# Patient Record
Sex: Female | Born: 1955 | Race: Black or African American | Hispanic: No | Smoking: Never smoker
Health system: Southern US, Community
[De-identification: ages and names within clinical notes are randomized; demographics above are authoritative.]

## PROBLEM LIST (undated history)

## (undated) DIAGNOSIS — I10 Essential (primary) hypertension: Secondary | ICD-10-CM

## (undated) DIAGNOSIS — C801 Malignant (primary) neoplasm, unspecified: Secondary | ICD-10-CM

---

## 1998-05-03 ENCOUNTER — Emergency Department (HOSPITAL_COMMUNITY): Admission: EM | Admit: 1998-05-03 | Discharge: 1998-05-03 | Payer: Self-pay | Admitting: Emergency Medicine

## 1998-06-11 ENCOUNTER — Emergency Department (HOSPITAL_COMMUNITY): Admission: EM | Admit: 1998-06-11 | Discharge: 1998-06-11 | Payer: Self-pay | Admitting: Emergency Medicine

## 1998-06-11 ENCOUNTER — Encounter: Payer: Self-pay | Admitting: Emergency Medicine

## 1999-02-07 ENCOUNTER — Emergency Department (HOSPITAL_COMMUNITY): Admission: EM | Admit: 1999-02-07 | Discharge: 1999-02-07 | Payer: Self-pay | Admitting: Emergency Medicine

## 1999-08-25 ENCOUNTER — Emergency Department (HOSPITAL_COMMUNITY): Admission: EM | Admit: 1999-08-25 | Discharge: 1999-08-25 | Payer: Self-pay | Admitting: Emergency Medicine

## 2002-06-25 ENCOUNTER — Encounter: Payer: Self-pay | Admitting: Emergency Medicine

## 2002-06-25 ENCOUNTER — Emergency Department (HOSPITAL_COMMUNITY): Admission: EM | Admit: 2002-06-25 | Discharge: 2002-06-26 | Payer: Self-pay

## 2004-05-31 ENCOUNTER — Emergency Department (HOSPITAL_COMMUNITY): Admission: EM | Admit: 2004-05-31 | Discharge: 2004-05-31 | Payer: Self-pay | Admitting: Family Medicine

## 2005-11-13 ENCOUNTER — Ambulatory Visit: Payer: Self-pay | Admitting: Nurse Practitioner

## 2005-11-13 ENCOUNTER — Ambulatory Visit: Payer: Self-pay | Admitting: *Deleted

## 2005-11-20 ENCOUNTER — Ambulatory Visit (HOSPITAL_COMMUNITY): Admission: RE | Admit: 2005-11-20 | Discharge: 2005-11-20 | Payer: Self-pay | Admitting: Family Medicine

## 2005-11-27 ENCOUNTER — Ambulatory Visit: Payer: Self-pay | Admitting: Nurse Practitioner

## 2005-12-11 ENCOUNTER — Ambulatory Visit: Payer: Self-pay | Admitting: Nurse Practitioner

## 2006-01-08 ENCOUNTER — Ambulatory Visit: Payer: Self-pay | Admitting: Nurse Practitioner

## 2006-01-26 ENCOUNTER — Ambulatory Visit: Payer: Self-pay | Admitting: Nurse Practitioner

## 2006-04-20 ENCOUNTER — Ambulatory Visit: Payer: Self-pay | Admitting: Nurse Practitioner

## 2006-06-21 ENCOUNTER — Ambulatory Visit: Payer: Self-pay | Admitting: Nurse Practitioner

## 2007-01-19 ENCOUNTER — Ambulatory Visit: Payer: Self-pay | Admitting: Internal Medicine

## 2007-04-13 ENCOUNTER — Encounter (INDEPENDENT_AMBULATORY_CARE_PROVIDER_SITE_OTHER): Payer: Self-pay | Admitting: *Deleted

## 2007-04-20 ENCOUNTER — Ambulatory Visit: Payer: Self-pay | Admitting: Family Medicine

## 2007-04-20 ENCOUNTER — Encounter (INDEPENDENT_AMBULATORY_CARE_PROVIDER_SITE_OTHER): Payer: Self-pay | Admitting: Nurse Practitioner

## 2007-04-20 LAB — CONVERTED CEMR LAB
ALT: 9 units/L (ref 0–35)
AST: 12 units/L (ref 0–37)
Albumin: 4.5 g/dL (ref 3.5–5.2)
Alkaline Phosphatase: 65 units/L (ref 39–117)
BUN: 14 mg/dL (ref 6–23)
Basophils Absolute: 0 10*3/uL (ref 0.0–0.1)
Basophils Relative: 0 % (ref 0–1)
CO2: 26 meq/L (ref 19–32)
Calcium: 9.3 mg/dL (ref 8.4–10.5)
Chloride: 98 meq/L (ref 96–112)
Creatinine, Ser: 1.2 mg/dL (ref 0.40–1.20)
Eosinophils Absolute: 0.2 10*3/uL (ref 0.0–0.7)
Eosinophils Relative: 2 % (ref 0–5)
Glucose, Bld: 74 mg/dL (ref 70–99)
HCT: 39 % (ref 36.0–46.0)
Hemoglobin: 13.4 g/dL (ref 12.0–15.0)
Lymphocytes Relative: 33 % (ref 12–46)
Lymphs Abs: 2.6 10*3/uL (ref 0.7–3.3)
MCHC: 34.4 g/dL (ref 30.0–36.0)
MCV: 83.5 fL (ref 78.0–100.0)
Monocytes Absolute: 0.4 10*3/uL (ref 0.2–0.7)
Monocytes Relative: 5 % (ref 3–11)
Neutro Abs: 4.8 10*3/uL (ref 1.7–7.7)
Neutrophils Relative %: 60 % (ref 43–77)
Platelets: 182 10*3/uL (ref 150–400)
Potassium: 3.3 meq/L — ABNORMAL LOW (ref 3.5–5.3)
RBC: 4.67 M/uL (ref 3.87–5.11)
RDW: 14.7 % — ABNORMAL HIGH (ref 11.5–14.0)
Sodium: 137 meq/L (ref 135–145)
TSH: 1.769 microintl units/mL (ref 0.350–5.50)
Total Bilirubin: 0.4 mg/dL (ref 0.3–1.2)
Total Protein: 8.2 g/dL (ref 6.0–8.3)
WBC: 8 10*3/uL (ref 4.0–10.5)

## 2007-04-26 ENCOUNTER — Ambulatory Visit (HOSPITAL_COMMUNITY): Admission: RE | Admit: 2007-04-26 | Discharge: 2007-04-26 | Payer: Self-pay | Admitting: Nurse Practitioner

## 2007-05-04 ENCOUNTER — Encounter (INDEPENDENT_AMBULATORY_CARE_PROVIDER_SITE_OTHER): Payer: Self-pay | Admitting: Nurse Practitioner

## 2007-05-04 ENCOUNTER — Ambulatory Visit: Payer: Self-pay | Admitting: Family Medicine

## 2007-05-04 LAB — CONVERTED CEMR LAB
Cholesterol: 188 mg/dL (ref 0–200)
HDL: 68 mg/dL (ref >39)
LDL Cholesterol: 109 mg/dL — ABNORMAL HIGH (ref 0–99)
Total CHOL/HDL Ratio: 2.8
Triglycerides: 56 mg/dL (ref <150)
VLDL: 11 mg/dL (ref 0–40)

## 2015-03-05 ENCOUNTER — Encounter (HOSPITAL_COMMUNITY): Payer: Self-pay | Admitting: Emergency Medicine

## 2015-03-05 ENCOUNTER — Emergency Department (HOSPITAL_COMMUNITY)
Admission: EM | Admit: 2015-03-05 | Discharge: 2015-03-05 | Disposition: A | Discharge status: Home / Self Care | Payer: Non-veteran care | Attending: Emergency Medicine | Admitting: Emergency Medicine

## 2015-03-05 ENCOUNTER — Emergency Department (HOSPITAL_COMMUNITY): Payer: Non-veteran care

## 2015-03-05 DIAGNOSIS — M79641 Pain in right hand: Secondary | ICD-10-CM | POA: Diagnosis not present

## 2015-03-05 DIAGNOSIS — I10 Essential (primary) hypertension: Secondary | ICD-10-CM | POA: Diagnosis not present

## 2015-03-05 DIAGNOSIS — R2232 Localized swelling, mass and lump, left upper limb: Secondary | ICD-10-CM | POA: Insufficient documentation

## 2015-03-05 HISTORY — DX: Essential (primary) hypertension: I10

## 2015-03-05 MED ORDER — NAPROXEN 500 MG PO TABS
500.0000 mg | ORAL_TABLET | Freq: Two times a day (BID) | ORAL | Status: DC
Start: 2015-03-05 — End: 2020-08-30

## 2015-03-05 NOTE — ED Notes (Signed)
Pt c/o left wrist/hand pain. 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 Pt supporting left wrist with right hand. No deformity.

## 2015-03-05 NOTE — Discharge Instructions (Signed)
Take the prescribed medication as directed. °Follow-up with your primary care physician. °Return to the ED for new or worsening symptoms. ° °

## 2015-03-05 NOTE — ED Provider Notes (Signed)
CSN: [id number]     Arrival date & time 03/05/15  1356 History   First MD Initiated Contact with Patient 03/05/15 1410     Chief Complaint  Patient presents with  . Hand Pain     (Consider location/radiation/quality/duration/timing/severity/associated sxs/prior Treatment) The history is provided by the patient and medical records.    59 year old female with history of hypertension, here with left hand pain. Patient states she was at [employer] training 4 days ago and thinks she hit her left hand on a piece of metal. She states initially pain was mild and she finished her day as normal. She states since this time she has had worsening pain of her left dorsal hand and some mild swelling. She denies any fever or chills. No numbness or weakness of left hand. Patient is right-hand dominant.  Patient has tried applying ice prior to arrival without relief.  VSS.  Past Medical History  Diagnosis Date  . Hypertension    Past Surgical History  Procedure Laterality Date  . Cesarean section     No family history on file. History  Substance Use Topics  . Smoking status: Never Smoker   . Smokeless tobacco: Not on file  . Alcohol Use: No   OB History    No data available     Review of Systems  Musculoskeletal: Positive for arthralgias.  All other systems reviewed and are negative.     Allergies  Review of patient's allergies indicates no known allergies.  Home Medications   Prior to Admission medications   Not on File   BP 151/82 mmHg  Pulse 98  Temp(Src) 98.5 F (36.9 C) (Oral)  Resp 16  SpO2 99%   Physical Exam  Constitutional: She is oriented to person, place, and time. She appears well-developed and well-nourished. No distress.  HENT:  Head: Normocephalic and atraumatic.  Mouth/Throat: Oropharynx is clear and moist.  Eyes: Conjunctivae and EOM are normal. Pupils are equal, round, and reactive to light.  Neck: Normal range of motion. Neck supple.  Cardiovascular: Normal  rate, regular rhythm and normal heart sounds.   Pulmonary/Chest: Effort normal and breath sounds normal. No respiratory distress. She has no wheezes.  Musculoskeletal: She exhibits no edema.       Left hand: She exhibits decreased range of motion, tenderness, bony tenderness and swelling.       Hands: Left hand with tenderness and mild swelling noted along dorsal aspect, worse along second and third metacarpals; no acute deformity noted, moving all fingers appropriately; hand is NVI  Neurological: She is alert and oriented to person, place, and time.  Skin: Skin is warm and dry. She is not diaphoretic.  Psychiatric: She has a normal mood and affect.  Nursing note and vitals reviewed.   ED Course  ORTHOPEDIC INJURY TREATMENT Date/Time: 03/05/2015 3:54 PM Performed by: Larene Pickett Authorized by: Larene Pickett Consent: Verbal consent obtained. Risks and benefits: risks, benefits and alternatives were discussed Consent given by: patient Patient understanding: patient states understanding of the procedure being performed Required items: required blood products, implants, devices, and special equipment available Patient identity confirmed: verbally with patient Time out: Immediately prior to procedure a "time out" was called to verify the correct patient, procedure, equipment, support staff and site/side marked as required. Injury location: hand Location details: left hand Injury type: soft tissue Pre-procedure neurovascular assessment: neurovascularly intact Local anesthesia used: no Patient sedated: no Immobilization: cast Supplies used: elastic bandage Post-procedure neurovascular assessment: post-procedure neurovascularly intact Patient  tolerance: Patient tolerated the procedure well with no immediate complications   (including critical care time) Labs Review Labs Reviewed - No data to display  Imaging Review Dg Hand Complete Left  03/05/2015   CLINICAL DATA:  Hand pain and  swelling. Injury 3 days ago. History of third PIP dislocation 10 years ago.  EXAM: LEFT HAND - COMPLETE 3+ VIEW  COMPARISON:  None.  FINDINGS: Moderate degenerative change base of thumb. Deformity of the third PIP joint with degenerative change. This appears chronic.  Negative for acute fracture.  IMPRESSION: Negative for acute fracture.   Electronically Signed   By: Franchot Gallo M.D.   On: 03/05/2015 15:26     EKG Interpretation None      MDM   Final diagnoses:  Hand pain, right   58 year old female here with left hand pain. She states she hit it on a piece of metal 4 days ago. She has mild swelling and tenderness to dorsum of left hand along second and third metacarpals. She has no acute bony deformity. Her hand is neurovascularly intact. X-ray was obtained which is negative for acute findings. Ace wrap was applied for comfort. Patient we discharged home with supportive care. She was encouraged to follow with her PCP.  Discussed plan with patient, he/she acknowledged understanding and agreed with plan of care.  Return precautions given for new or worsening symptoms.  Larene Pickett, PA-C 03/05/15 1558  Blanchie Dessert, MD 03/11/15 2115

## 2015-04-13 ENCOUNTER — Emergency Department (HOSPITAL_COMMUNITY)
Admission: EM | Admit: 2015-04-13 | Discharge: 2015-04-13 | Disposition: A | Discharge status: Home / Self Care | Payer: Non-veteran care | Attending: Emergency Medicine | Admitting: Emergency Medicine

## 2015-04-13 ENCOUNTER — Emergency Department (HOSPITAL_COMMUNITY): Payer: Non-veteran care

## 2015-04-13 ENCOUNTER — Encounter (HOSPITAL_COMMUNITY): Payer: Self-pay | Admitting: *Deleted

## 2015-04-13 DIAGNOSIS — N938 Other specified abnormal uterine and vaginal bleeding: Secondary | ICD-10-CM | POA: Insufficient documentation

## 2015-04-13 DIAGNOSIS — R61 Generalized hyperhidrosis: Secondary | ICD-10-CM | POA: Insufficient documentation

## 2015-04-13 DIAGNOSIS — I10 Essential (primary) hypertension: Secondary | ICD-10-CM | POA: Insufficient documentation

## 2015-04-13 DIAGNOSIS — N939 Abnormal uterine and vaginal bleeding, unspecified: Secondary | ICD-10-CM

## 2015-04-13 DIAGNOSIS — R42 Dizziness and giddiness: Secondary | ICD-10-CM | POA: Insufficient documentation

## 2015-04-13 DIAGNOSIS — R531 Weakness: Secondary | ICD-10-CM | POA: Insufficient documentation

## 2015-04-13 DIAGNOSIS — Z791 Long term (current) use of non-steroidal anti-inflammatories (NSAID): Secondary | ICD-10-CM | POA: Insufficient documentation

## 2015-04-13 DIAGNOSIS — R109 Unspecified abdominal pain: Secondary | ICD-10-CM | POA: Insufficient documentation

## 2015-04-13 LAB — CBC WITH DIFFERENTIAL/PLATELET
Basophils Absolute: 0 K/uL (ref 0.0–0.1)
Basophils Relative: 0 %
Eosinophils Absolute: 0.2 K/uL (ref 0.0–0.7)
Eosinophils Relative: 2 %
HCT: 32.4 % — ABNORMAL LOW (ref 36.0–46.0)
Hemoglobin: 11.4 g/dL — ABNORMAL LOW (ref 12.0–15.0)
Lymphocytes Relative: 23 %
Lymphs Abs: 1.7 K/uL (ref 0.7–4.0)
MCH: 29.1 pg (ref 26.0–34.0)
MCHC: 35.2 g/dL (ref 30.0–36.0)
MCV: 82.7 fL (ref 78.0–100.0)
Monocytes Absolute: 0.4 K/uL (ref 0.1–1.0)
Monocytes Relative: 6 %
Neutro Abs: 5.2 K/uL (ref 1.7–7.7)
Neutrophils Relative %: 69 %
Platelets: 204 K/uL (ref 150–400)
RBC: 3.92 MIL/uL (ref 3.87–5.11)
RDW: 14.6 % (ref 11.5–15.5)
WBC: 7.5 K/uL (ref 4.0–10.5)

## 2015-04-13 LAB — COMPREHENSIVE METABOLIC PANEL WITH GFR
ALT: 15 U/L (ref 14–54)
AST: 18 U/L (ref 15–41)
Albumin: 3.3 g/dL — ABNORMAL LOW (ref 3.5–5.0)
Alkaline Phosphatase: 51 U/L (ref 38–126)
Anion gap: 8 (ref 5–15)
BUN: 8 mg/dL (ref 6–20)
CO2: 26 mmol/L (ref 22–32)
Calcium: 8.6 mg/dL — ABNORMAL LOW (ref 8.9–10.3)
Chloride: 103 mmol/L (ref 101–111)
Creatinine, Ser: 1.22 mg/dL — ABNORMAL HIGH (ref 0.44–1.00)
GFR calc Af Amer: 55 mL/min — ABNORMAL LOW
GFR calc non Af Amer: 47 mL/min — ABNORMAL LOW
Glucose, Bld: 161 mg/dL — ABNORMAL HIGH (ref 65–99)
Potassium: 3 mmol/L — ABNORMAL LOW (ref 3.5–5.1)
Sodium: 137 mmol/L (ref 135–145)
Total Bilirubin: 0.5 mg/dL (ref 0.3–1.2)
Total Protein: 6.7 g/dL (ref 6.5–8.1)

## 2015-04-13 MED ORDER — MEDROXYPROGESTERONE ACETATE 10 MG PO TABS: 10.0000 mg | ORAL_TABLET | Freq: Every day | ORAL | Status: AC

## 2015-04-13 NOTE — Discharge Instructions (Signed)
Provera as prescribed.  Follow-up with your GYN doctor in the upcoming week, and return to the ER if symptoms significantly worsen or change.   Abnormal Uterine Bleeding Abnormal uterine bleeding can affect women at various stages in life, including teenagers, women in their reproductive years, pregnant women, and women who have reached menopause. Several kinds of uterine bleeding are considered abnormal, including:  Bleeding or spotting between periods.   Bleeding after sexual intercourse.   Bleeding that is heavier or more than normal.   Periods that last longer than usual.  Bleeding after menopause.  Many cases of abnormal uterine bleeding are minor and simple to treat, while others are more serious. Any type of abnormal bleeding should be evaluated by your health care provider. Treatment will depend on the cause of the bleeding. HOME CARE INSTRUCTIONS Monitor your condition for any changes. The following actions may help to alleviate any discomfort you are experiencing:  Avoid the use of tampons and douches as directed by your health care provider.  Change your pads frequently. You should get regular pelvic exams and Pap tests. Keep all follow-up appointments for diagnostic tests as directed by your health care provider.  SEEK MEDICAL CARE IF:   Your bleeding lasts more than 1 week.   You feel dizzy at times.  SEEK IMMEDIATE MEDICAL CARE IF:   You pass out.   You are changing pads every 15 to 30 minutes.   You have abdominal pain.  You have a fever.   You become sweaty or weak.   You are passing large blood clots from the vagina.   You start to feel nauseous and vomit. MAKE SURE YOU:   Understand these instructions.  Will watch your condition.  Will get help right away if you are not doing well or get worse. Document Released: 07/13/2005 Document Revised: 07/18/2013 Document Reviewed: 02/09/2013 [facility] Patient Information 2015 [place], [place].  This information is not intended to replace advice given to you by your health care provider. Make sure you discuss any questions you have with your health care provider.

## 2015-04-13 NOTE — ED Notes (Signed)
Heavy vaginal bleeding since this am she has seen her doctor and is having a workup for the same sept 30.. lmp  Last month.  She saw her doctor 2 weeks ago

## 2015-04-13 NOTE — ED Provider Notes (Signed)
CSN: [id number]   Arrival date & time 04/13/15 0012  History  This chart was scribed for Veryl Speak, MD by Altamease Oiler, ED Scribe. This patient was seen in room A04C/A04C and the patient's care was started at 12:41 AM.  Chief Complaint  Patient presents with  . Vaginal Bleeding    HPI The history is provided by the patient. No language interpreter was used.   Elizabeth Ali is a 59 y.o. female who presents to the Emergency Department complaining of heavy vaginal bleeding with onset this morning. The bleeding was heavy earlier in the day, slowed down at work, and increased again 1 hour PTA. She has been passing clots. The blood is bright red and the clots are dark red. Associated symptoms include dizziness, generalized weakness, sweating, and abdominal cramping. Pt denies history of hysterectomy or oophorectomy.  She saw her doctor for vaginal spotting 2 weeks ago and is scheduled for GYN f/u on 04/26/15.  Past Medical History  Diagnosis Date  . Hypertension     Past Surgical History  Procedure Laterality Date  . Cesarean section      No family history on file.  Social History  Substance Use Topics  . Smoking status: Never Smoker   . Smokeless tobacco: None  . Alcohol Use: No     Review of Systems 10 Systems reviewed and all are negative for acute change except as noted in the HPI. Home Medications   Prior to Admission medications   Medication Sig Start Date End Date Taking? Authorizing Provider  naproxen (NAPROSYN) 500 MG tablet Take 1 tablet (500 mg total) by mouth 2 (two) times daily with a meal. 03/05/15   Larene Pickett, PA-C    Allergies  Review of patient's allergies indicates no known allergies.  Triage Vitals: BP 100/66 mmHg  Pulse 87  Temp(Src) 97.6 F (36.4 C) (Oral)  Resp 16  SpO2 96%  Physical Exam  Constitutional: She is oriented to person, place, and time. She appears well-developed and well-nourished. No distress.  HENT:  Head: Normocephalic and  atraumatic.  Eyes: EOM are normal.  Neck: Normal range of motion.  Cardiovascular: Normal rate, regular rhythm and normal heart sounds.   Pulmonary/Chest: Effort normal and breath sounds normal.  Abdominal: Soft. She exhibits no distension and no mass. There is no tenderness. There is no rebound and no guarding.  Musculoskeletal: Normal range of motion.  Neurological: She is alert and oriented to person, place, and time.  Skin: Skin is warm and dry.  Psychiatric: She has a normal mood and affect. Judgment normal.  Nursing note and vitals reviewed.   ED Course  Procedures   DIAGNOSTIC STUDIES: Oxygen Saturation is 96% on RA, normal by my interpretation.    COORDINATION OF CARE: 12:45 AM Discussed treatment plan which includes lab work, pelvic US, and pelvic exam with pt at bedside and pt agreed to plan.  I, Veryl Speak, MD, personally reviewed and evaluated these images and lab results as part of my medical decision-making.   Labs Reviewed  CBC WITH DIFFERENTIAL/PLATELET - Abnormal; Notable for the following:    Hemoglobin 11.4 (*)    HCT 32.4 (*)    All other components within normal limits  COMPREHENSIVE METABOLIC PANEL - Abnormal; Notable for the following:    Potassium 3.0 (*)    Glucose, Bld 161 (*)    Creatinine, Ser 1.22 (*)    Calcium 8.6 (*)    Albumin 3.3 (*)    GFR calc non  Af Amer 47 (*)    GFR calc Af Amer 55 (*)    All other components within normal limits    Imaging Review US Transvaginal Non-ob  04/13/2015   CLINICAL DATA:  Heavy vaginal bleeding beginning this morning at 7 a.m. last menstrual period March 16, 2015. G3 P3.  EXAM: TRANSABDOMINAL AND TRANSVAGINAL ULTRASOUND OF PELVIS  TECHNIQUE: Both transabdominal and transvaginal ultrasound examinations of the pelvis were performed. Transabdominal technique was performed for global imaging of the pelvis including uterus, ovaries, adnexal regions, and pelvic cul-de-sac. It was necessary to proceed with  endovaginal exam following the transabdominal exam to visualize the endometrium.  COMPARISON:  None  FINDINGS: Technically limited evaluation, endometrium not well sonographically characterized.  Uterus  Measurements: 11.1 x 6.5 x 6.2 cm. No fibroids or other mass visualized. Small anechoic nabothian cyst at the level of the cervix.  Endometrium  Thickness: Approximately 6 mm. No focal abnormality visualized though limited assessment.  Right ovary  Not sonographically identified.  Left ovary  Not sonographically identified.  Other findings  No free fluid.  IMPRESSION: Ovaries not sonographically identified, endometrium not well sonographically characterized.  No acute pelvic process.   Electronically Signed   By: Elon Alas M.D.   On: 04/13/2015 02:59   US Pelvis Complete  04/13/2015   CLINICAL DATA:  Heavy vaginal bleeding beginning this morning at 7 a.m. last menstrual period March 16, 2015. G3 P3.  EXAM: TRANSABDOMINAL AND TRANSVAGINAL ULTRASOUND OF PELVIS  TECHNIQUE: Both transabdominal and transvaginal ultrasound examinations of the pelvis were performed. Transabdominal technique was performed for global imaging of the pelvis including uterus, ovaries, adnexal regions, and pelvic cul-de-sac. It was necessary to proceed with endovaginal exam following the transabdominal exam to visualize the endometrium.  COMPARISON:  None  FINDINGS: Technically limited evaluation, endometrium not well sonographically characterized.  Uterus  Measurements: 11.1 x 6.5 x 6.2 cm. No fibroids or other mass visualized. Small anechoic nabothian cyst at the level of the cervix.  Endometrium  Thickness: Approximately 6 mm. No focal abnormality visualized though limited assessment.  Right ovary  Not sonographically identified.  Left ovary  Not sonographically identified.  Other findings  No free fluid.  IMPRESSION: Ovaries not sonographically identified, endometrium not well sonographically characterized.  No acute pelvic  process.   Electronically Signed   By: Elon Alas M.D.   On: 04/13/2015 02:59      MDM   Final diagnoses:  Vaginal bleeding     Hemoglobin is stable, vitals are stable, and ultrasound reveals no significant uterine abnormality. She will be started on Provera and advised to follow-up with her gynecologist. She understands to return if symptoms significantly worsen or change.   I personally performed the services described in this documentation, which was scribed in my presence. The recorded information has been reviewed and is accurate.     Veryl Speak, MD 04/13/15 [id number]

## 2020-03-19 HISTORY — PX: BREAST LUMPECTOMY: SHX2

## 2020-07-31 ENCOUNTER — Ambulatory Visit
Admission: RE | Admit: 2020-07-31 | Discharge: 2020-07-31 | Disposition: A | Discharge status: Home / Self Care | Payer: Self-pay | Source: Ambulatory Visit | Attending: Radiation Oncology | Admitting: Radiation Oncology

## 2020-07-31 ENCOUNTER — Other Ambulatory Visit: Payer: Self-pay | Admitting: Radiation Oncology

## 2020-07-31 ENCOUNTER — Inpatient Hospital Stay
Admission: RE | Admit: 2020-07-31 | Discharge: 2020-07-31 | Disposition: A | Discharge status: Home / Self Care | Payer: Self-pay | Source: Ambulatory Visit | Attending: Radiation Oncology | Admitting: Radiation Oncology

## 2020-07-31 DIAGNOSIS — C50919 Malignant neoplasm of unspecified site of unspecified female breast: Secondary | ICD-10-CM

## 2020-08-06 NOTE — Progress Notes (Signed)
Location of Breast Cancer: [facility] Left Breast   Did patient present with symptoms (if so, please note symptoms) or was this found on screening mammography?: Routine mammogram  Mammogram: Suspicious mass in the left breast at the 10 o'clock axis 8 cm from the nipple and was designated a BI-RADS 4 study.   Histology per Pathology Report: Left Breast 03/19/2020  Receptor Status: ER(-), PR (-), Her2-neu (-), Ki-()    Past/Anticipated interventions by surgeon, if any: Lumpectomy 03/19/2020 8 mm   Past/Anticipated interventions by medical oncology, if any: Chemotherapy  Dr. Levora Angel ([place]) 07/15/2020 - 3 cycles of adjuvant chemotherapy with docetaxel and cyclophosphamide. -Finished chemotherapy on 08/05/2020   Lymphedema issues, if any: No   Pain issues, if any:  No  SAFETY ISSUES:  Prior radiation? No  Pacemaker/ICD? No  Possible current pregnancy? Postmenopausal  Is the patient on methotrexate? No  Current Complaints / other details:      Cori Razor, RN 08/06/2020,1:17 PM

## 2020-08-07 ENCOUNTER — Encounter: Payer: Self-pay | Admitting: Radiation Oncology

## 2020-08-07 ENCOUNTER — Encounter: Payer: Self-pay | Admitting: Licensed Clinical Social Worker

## 2020-08-07 ENCOUNTER — Other Ambulatory Visit: Payer: Self-pay

## 2020-08-07 ENCOUNTER — Ambulatory Visit
Admission: RE | Admit: 2020-08-07 | Discharge: 2020-08-07 | Disposition: A | Discharge status: Home / Self Care | Payer: Non-veteran care | Source: Ambulatory Visit | Attending: Radiation Oncology | Admitting: Radiation Oncology

## 2020-08-07 VITALS — Ht 62.5 in | Wt 200.0 lb

## 2020-08-07 DIAGNOSIS — C50012 Malignant neoplasm of nipple and areola, left female breast: Secondary | ICD-10-CM

## 2020-08-07 DIAGNOSIS — Z171 Estrogen receptor negative status [ER-]: Secondary | ICD-10-CM

## 2020-08-07 NOTE — Progress Notes (Signed)
Radiation Oncology         ([phone] ________________________________  Initial Outpatient Consultation - Conducted via telephone due to current COVID-19 concerns for limiting patient exposure  I spoke with the patient to conduct this consult visit via telephone to spare the patient unnecessary potential exposure in the healthcare setting during the current COVID-19 pandemic. The patient was notified in advance and was offered a [place] meeting to allow for face to face communication but unfortunately reported that they did not have the appropriate resources/technology to support such a visit and instead preferred to proceed with a telephone consult.     Name: Elizabeth Ali        MRN: [id number]  Date of Service: 08/07/2020 DOB: Feb 01, 1956  REFERRING PHYSICIAN: Dr. Athena Masse at the [place].   DIAGNOSIS: The encounter diagnosis was Malignant neoplasm involving both nipple and areola of left breast in female, estrogen receptor negative ([place]).   HISTORY OF PRESENT ILLNESS: Elizabeth Ali is a 65 y.o. female with a recently diagnosed left breast cancer.  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  She did undergo lumpectomy on 03/19/2020 which revealed a grade 2, 8 mm invasive ductal carcinoma with 3 sentinel lymph nodes sampled that were negative.  Margins were felt completely clear but her tumor was triple negative.    PREVIOUS RADIATION THERAPY: No   PAST MEDICAL HISTORY:  Past Medical History:  Diagnosis Date  . Hypertension        PAST SURGICAL HISTORY: Past Surgical History:  Procedure Laterality Date  . CESAREAN SECTION       FAMILY HISTORY: No family history on file.   SOCIAL HISTORY:  reports that she has never smoked. She does not have any smokeless  tobacco history on file. She reports that she does not drink alcohol and does not use drugs.   ALLERGIES: Patient has no known allergies.   MEDICATIONS:  Current Outpatient Medications  Medication Sig Dispense Refill  . medroxyPROGESTERone (PROVERA) 10 MG tablet Take 1 tablet (10 mg total) by mouth daily. 10 tablet 0  . naproxen (NAPROSYN) 500 MG tablet Take 1 tablet (500 mg total) by mouth 2 (two) times daily with a meal. 30 tablet 0   No current facility-administered medications for this encounter.     REVIEW OF SYSTEMS: On review of systems, the patient reports that she is doing well overall. She denies any chest pain, shortness of breath, cough, fevers, chills, night sweats, unintended weight changes. She denies any bowel or bladder disturbances, and denies abdominal pain, nausea or vomiting. She denies any new musculoskeletal or joint aches or pains. A complete review of systems is obtained and is otherwise negative.     PHYSICAL EXAM:  Unable to assess due to encounter type.   ECOG = 0  0 - Asymptomatic (Fully active, able to carry on all predisease activities without restriction)  1 - Symptomatic but completely ambulatory (Restricted in physically strenuous activity but ambulatory and able to carry out work of a light or sedentary nature. For example, light housework, office work)  2 - Symptomatic, <50% in bed during the day (Ambulatory and capable of all self care but unable to carry out any work activities. Up and about more than 50% of waking hours)  3 -  Symptomatic, >50% in bed, but not bedbound (Capable of only limited self-care, confined to bed or chair 50% or more of waking hours)  4 - Bedbound (Completely disabled. Cannot carry on any self-care. Totally confined to bed or chair)  5 - Death   Eustace Pen MM, Creech RH, Tormey DC, et al. [phone]). "Toxicity and response criteria of the [facility]". [place] Oncol. 5 (6): 649-55    LABORATORY  DATA:  Lab Results  Component Value Date   WBC 7.5 04/13/2015   HGB 11.4 (L) 04/13/2015   HCT 32.4 (L) 04/13/2015   MCV 82.7 04/13/2015   PLT 204 04/13/2015   Lab Results  Component Value Date   NA 137 04/13/2015   K 3.0 (L) 04/13/2015   CL 103 04/13/2015   CO2 26 04/13/2015   Lab Results  Component Value Date   ALT 15 04/13/2015   AST 18 04/13/2015   ALKPHOS 51 04/13/2015   BILITOT 0.5 04/13/2015      RADIOGRAPHY: No results found.     IMPRESSION/PLAN: 1. 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 Dr. Lisbeth Renshaw discusses the pathology findings and reviews the nature of left breast disease. She would benefit from external radiotherapy to the breast  to reduce risks of local recurrence. We discussed the risks, benefits, short, and long term effects of radiotherapy, as well as the curative intent, and the patient is interested in proceeding. Dr. Lisbeth Renshaw discusses the delivery and logistics of radiotherapy and anticipates a course of 4 weeks of radiotherapy.  She will come for simulation on   Given current concerns for patient exposure during the COVID-19 pandemic, this encounter was conducted via telephone.  The patient has provided two factor identification and has given verbal consent for this type of encounter and has been advised to only accept a meeting of this type in a secure network environment. The time spent during this encounter was 60 minutes including preparation, discussion, and coordination of the patient's care. The attendants for this meeting include Blenda Nicely, RN, Dr. Lisbeth Renshaw, Hayden Pedro  and [facility].  During the encounter,  Blenda Nicely, RN, Dr. Lisbeth Renshaw,  were located at [facility] Radiation Oncology Department. Hayden Pedro was located remotely at home. [name] was located at home.   The above documentation reflects my direct findings during this shared patient visit. Please  see the separate note by Dr. Lisbeth Renshaw on this date for the remainder of the patient's plan of care.    Carola Rhine, PAC

## 2020-08-07 NOTE — Progress Notes (Signed)
[facility] Psychosocial Distress Screening Clinical Social Work  Clinical Social Work was referred by distress screening protocol.  The patient scored a 9 on the Psychosocial Distress Thermometer which indicates severe distress. Clinical Social Worker contacted patient by phone to assess for distress and other psychosocial needs.     ONCBCN DISTRESS SCREENING 08/07/2020  Screening Type Initial Screening  Distress experienced in past week (1-10) 9  Emotional problem type Adjusting to illness;Nervousness/Anxiety  Physical Problem type Getting around  Other Contact via phone   Patient has found good support from her family, particularly her mom, as she goes through her cancer treatment. She is concerned about coverage since radiation will be here and not at the [place], but will call them next week to determine exactly what coverage will be.  CSW introduced support team and programming and will provide calendar of support programs at next appointment. No other needs at this time.  Clinical Social Worker follow up needed: No.  If yes, follow up plan:  Elizabeth Ali E Elizabeth Perlow, LCSW

## 2020-08-13 ENCOUNTER — Ambulatory Visit: Payer: Non-veteran care | Admitting: Radiation Oncology

## 2020-08-16 ENCOUNTER — Ambulatory Visit
Admission: RE | Admit: 2020-08-16 | Discharge: 2020-08-16 | Disposition: A | Discharge status: Home / Self Care | Payer: No Typology Code available for payment source | Source: Ambulatory Visit | Attending: Radiation Oncology | Admitting: Radiation Oncology

## 2020-08-16 DIAGNOSIS — Z171 Estrogen receptor negative status [ER-]: Secondary | ICD-10-CM | POA: Diagnosis not present

## 2020-08-16 DIAGNOSIS — C50012 Malignant neoplasm of nipple and areola, left female breast: Secondary | ICD-10-CM | POA: Insufficient documentation

## 2020-08-20 DIAGNOSIS — C50012 Malignant neoplasm of nipple and areola, left female breast: Secondary | ICD-10-CM | POA: Diagnosis not present

## 2020-08-26 ENCOUNTER — Ambulatory Visit: Payer: No Typology Code available for payment source | Admitting: Radiation Oncology

## 2020-08-27 ENCOUNTER — Ambulatory Visit: Payer: No Typology Code available for payment source

## 2020-08-28 ENCOUNTER — Ambulatory Visit
Admission: RE | Admit: 2020-08-28 | Discharge: 2020-08-28 | Disposition: A | Discharge status: Home / Self Care | Payer: No Typology Code available for payment source | Source: Ambulatory Visit | Attending: Radiation Oncology | Admitting: Radiation Oncology

## 2020-08-28 ENCOUNTER — Other Ambulatory Visit: Payer: Self-pay

## 2020-08-28 DIAGNOSIS — C50012 Malignant neoplasm of nipple and areola, left female breast: Secondary | ICD-10-CM | POA: Insufficient documentation

## 2020-08-28 DIAGNOSIS — Z171 Estrogen receptor negative status [ER-]: Secondary | ICD-10-CM | POA: Diagnosis present

## 2020-08-29 ENCOUNTER — Ambulatory Visit: Payer: No Typology Code available for payment source

## 2020-08-29 NOTE — Progress Notes (Incomplete)
Pt here for patient teaching.  Pt given Radiation and You booklet, skin care instructions, Alra deodorant and Radiaplex gel.  Reviewed areas of pertinence such as fatigue, hair loss, skin changes, breast tenderness and breast swelling . Pt able to give teach back of to pat skin and use unscented/gentle soap,apply Radiaplex bid, avoid applying anything to skin within 4 hours of treatment, avoid wearing an under wire bra and to use an electric razor if they must shave. Pt verbalizes understanding of information given and will contact nursing with any questions or concerns.     Kalan Rinn M. Beata Beason RN, BSN      

## 2020-08-30 ENCOUNTER — Emergency Department
Admission: EM | Admit: 2020-08-30 | Discharge: 2020-08-30 | Disposition: A | Discharge status: Home / Self Care | Payer: No Typology Code available for payment source | Attending: Emergency Medicine | Admitting: Emergency Medicine

## 2020-08-30 ENCOUNTER — Other Ambulatory Visit: Payer: Self-pay

## 2020-08-30 ENCOUNTER — Ambulatory Visit
Admission: RE | Admit: 2020-08-30 | Discharge: 2020-08-30 | Disposition: A | Discharge status: Home / Self Care | Payer: No Typology Code available for payment source | Source: Ambulatory Visit | Attending: Radiation Oncology | Admitting: Radiation Oncology

## 2020-08-30 ENCOUNTER — Emergency Department: Payer: No Typology Code available for payment source

## 2020-08-30 ENCOUNTER — Encounter: Payer: Self-pay | Admitting: Emergency Medicine

## 2020-08-30 ENCOUNTER — Ambulatory Visit
Admission: RE | Admit: 2020-08-30 | Discharge: 2020-08-30 | Disposition: A | Discharge status: Home / Self Care | Payer: Medicare Other | Source: Ambulatory Visit

## 2020-08-30 VITALS — BP 115/77 | HR 98 | Temp 99.3°F | Resp 20

## 2020-08-30 DIAGNOSIS — M79604 Pain in right leg: Secondary | ICD-10-CM

## 2020-08-30 DIAGNOSIS — Z79899 Other long term (current) drug therapy: Secondary | ICD-10-CM | POA: Insufficient documentation

## 2020-08-30 DIAGNOSIS — M5416 Radiculopathy, lumbar region: Secondary | ICD-10-CM | POA: Insufficient documentation

## 2020-08-30 DIAGNOSIS — I10 Essential (primary) hypertension: Secondary | ICD-10-CM | POA: Diagnosis not present

## 2020-08-30 DIAGNOSIS — E876 Hypokalemia: Secondary | ICD-10-CM | POA: Insufficient documentation

## 2020-08-30 DIAGNOSIS — Z853 Personal history of malignant neoplasm of breast: Secondary | ICD-10-CM | POA: Diagnosis not present

## 2020-08-30 HISTORY — DX: Malignant (primary) neoplasm, unspecified: C80.1

## 2020-08-30 LAB — BASIC METABOLIC PANEL
Anion gap: 8 (ref 5–15)
BUN: 14 mg/dL (ref 8–23)
CO2: 26 mmol/L (ref 22–32)
Calcium: 8.9 mg/dL (ref 8.9–10.3)
Chloride: 102 mmol/L (ref 98–111)
Creatinine, Ser: 1 mg/dL (ref 0.44–1.00)
GFR, Estimated: >60 mL/min (ref >60)
Glucose, Bld: 173 mg/dL — ABNORMAL HIGH (ref 70–99)
Potassium: 3 mmol/L — ABNORMAL LOW (ref 3.5–5.1)
Sodium: 136 mmol/L (ref 135–145)

## 2020-08-30 LAB — CBC WITH DIFFERENTIAL/PLATELET
Abs Immature Granulocytes: 0.01 10*3/uL (ref 0.00–0.07)
Basophils Absolute: 0 10*3/uL (ref 0.0–0.1)
Basophils Relative: 1 %
Eosinophils Absolute: 0.1 10*3/uL (ref 0.0–0.5)
Eosinophils Relative: 2 %
HCT: 36.6 % (ref 36.0–46.0)
Hemoglobin: 12.2 g/dL (ref 12.0–15.0)
Immature Granulocytes: 0 %
Lymphocytes Relative: 21 %
Lymphs Abs: 1.6 10*3/uL (ref 0.7–4.0)
MCH: 29.3 pg (ref 26.0–34.0)
MCHC: 33.3 g/dL (ref 30.0–36.0)
MCV: 88 fL (ref 80.0–100.0)
Monocytes Absolute: 0.5 10*3/uL (ref 0.1–1.0)
Monocytes Relative: 7 %
Neutro Abs: 5.2 10*3/uL (ref 1.7–7.7)
Neutrophils Relative %: 69 %
Platelets: 272 10*3/uL (ref 150–400)
RBC: 4.16 MIL/uL (ref 3.87–5.11)
RDW: 17.4 % — ABNORMAL HIGH (ref 11.5–15.5)
WBC: 7.5 10*3/uL (ref 4.0–10.5)
nRBC: 0 % (ref 0.0–0.2)

## 2020-08-30 LAB — MAGNESIUM: Magnesium: 1.9 mg/dL (ref 1.7–2.4)

## 2020-08-30 MED ORDER — OXYCODONE-ACETAMINOPHEN 5-325 MG PO TABS: 1.0000 | ORAL_TABLET | Freq: Three times a day (TID) | ORAL | 0 refills | Status: DC | PRN

## 2020-08-30 MED ORDER — POTASSIUM CHLORIDE ER 10 MEQ PO TBCR: EXTENDED_RELEASE_TABLET | ORAL | 0 refills | Status: AC

## 2020-08-30 MED ORDER — NAPROXEN 375 MG PO TABS: 375.0000 mg | ORAL_TABLET | Freq: Two times a day (BID) | ORAL | 0 refills | Status: AC

## 2020-08-30 MED ORDER — OXYCODONE HCL 5 MG PO TABS: 2.5000 mg | ORAL_TABLET | Freq: Four times a day (QID) | ORAL | 0 refills | Status: AC | PRN

## 2020-08-30 MED ORDER — POTASSIUM CHLORIDE ER 10 MEQ PO TBCR: 20.0000 meq | EXTENDED_RELEASE_TABLET | Freq: Every day | ORAL | 0 refills | Status: DC

## 2020-08-30 MED ORDER — OXYCODONE-ACETAMINOPHEN 5-325 MG PO TABS
1.0000 | ORAL_TABLET | Freq: Once | ORAL | Status: AC
Start: 2020-08-30 — End: 2020-08-30
  Administered 2020-08-30: 1 via ORAL
  Filled 2020-08-30: qty 1

## 2020-08-30 NOTE — ED Triage Notes (Addendum)
Pt c/o pain to right leg. Pt states pain started in right calf 1 week ago and improved after 1 day and having family member "rub" the area, but pain migrated to right upper anterior and middle thigh. Pt describes pain as tight, warm feeling. Denies known injury/trauma to leg.  Reports SOB in urgent care office, but pt attributes it to wearing mask.  Pt required wheelchair for transport to room.  Denies fever, CP, n/v, dizziness, back pain.  Pt completed chemo tx, scheduled to go to radiation therapy today, but cancelled 2/2 leg pain. Right calf feels tighter than left.  Tylenol 650mg  at 1000. Notified T. Bast of pt status and c/c; advised pt to go to ED STAT for r/o DVT.

## 2020-08-30 NOTE — ED Provider Notes (Signed)
[facility] Emergency Department Provider Note  ____________________________________________   Event Date/Time   First MD Initiated Contact with Patient 08/30/20 1748     (approximate)  I have reviewed the triage vital signs and the nursing notes.   HISTORY  Chief Complaint Leg Pain    HPI Elizabeth Ali is a 65 y.o. female here with right leg pain. The patient states that for the last week, she has had aching, throbbing, somewhat positional right leg pain. It radiates from her lower back down throughout her right leg. It is aching and throbbing. Is worse with palpation and certain movements. She does note that she recently completed chemo last month, and she has been moving around much and packing her house for moving recently. Denies any direct falls or trauma. No numbness or weakness. She told her doctor about this and was sent here for evaluation of DVT/PE. No leg swelling. No history of DVT. No chest pain. She does note that she has a history of hypokalemia as well and was concerned about her electrolytes. She said no nausea or vomiting. No diarrhea.        Past Medical History:  Diagnosis Date  . Cancer ([place])   . Hypertension     Patient Active Problem List   Diagnosis Date Noted  . 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 BREAST LUMPECTOMY Left 03/19/2020  . CESAREAN SECTION      Prior to Admission medications   Medication Sig Start Date End Date Taking? Authorizing Provider  naproxen (NAPROSYN) 375 MG tablet Take 1 tablet (375 mg total) by mouth 2 (two) times daily with a meal for 5 days. 08/30/20 09/04/20 Yes Duffy Bruce, MD  oxyCODONE (ROXICODONE) 5 MG immediate release tablet Take 0.5-1 tablets (2.5-5 mg total) by mouth every 6 (six) hours as needed for moderate pain or severe pain (0.5 tab for  moderate pain, 1 tab for severe pain; no more than 6 tabs daily). 08/30/20 08/30/21 Yes Duffy Bruce, MD  diclofenac Sodium (VOLTAREN) 1 % GEL Apply 4 g topically 4 (four) times daily. Apply 4 grams to affected area four times a day- Total daily dose should not exceed 32 grams per day over all affected joints.    [provider]  hydrochlorothiazide (HYDRODIURIL) 25 MG tablet Take 25 mg by mouth daily. Take one tablet by mouth once daily with banana or glass of orange juice.    [provider]  losartan (COZAAR) 25 MG tablet Take 25 mg by mouth daily. Take one tablet by mouth once daily.    [provider]  medroxyPROGESTERone (PROVERA) 10 MG tablet Take 1 tablet (10 mg total) by mouth daily. 04/13/15   Veryl Speak, MD  potassium chloride (KLOR-CON) 10 MEQ tablet Take 10 mEq by mouth 2 (two) times daily. Take one tablet by mouth once daily.    [provider]  potassium chloride (KLOR-CON) 10 MEQ tablet Take 2 tablets (20 mEq total) by mouth 2 (two) times daily for 3 days, THEN 1 tablet (10 mEq total) 2 (two) times daily for 7 days, THEN 1 tablet (10 mEq total) daily for 20 days. 08/30/20 09/29/20  Duffy Bruce, MD  Vaginal Lubricant (REPLENS) GEL Place vaginally daily as needed. A sufficient amount into vagina at bedtime as needed for dryness    [provider]    Allergies Patient  has no known allergies.  Family History  Problem Relation Age of Onset  . Cancer Mother        Unknown type (?colon or prostate)  . Cancer Maternal Uncle        Unknown type    Social History Social History   Tobacco Use  . Smoking status: Never Smoker  . Smokeless tobacco: Never Used  Substance Use Topics  . Alcohol use: Not Currently  . Drug use: No    Review of Systems  Review of Systems  Constitutional: Negative for chills and fever.  HENT: Negative for sore throat.   Respiratory: Negative for shortness of breath.   Cardiovascular: Negative for chest  pain.  Gastrointestinal: Negative for abdominal pain.  Genitourinary: Negative for flank pain.  Musculoskeletal: Positive for arthralgias and myalgias. Negative for neck pain.  Skin: Negative for rash and wound.  Allergic/Immunologic: Negative for immunocompromised state.  Neurological: Negative for weakness and numbness.  Hematological: Does not bruise/bleed easily.     ____________________________________________  PHYSICAL EXAM:      VITAL SIGNS: ED Triage Vitals  Enc Vitals Group     BP 08/30/20 1319 112/69     Pulse Rate 08/30/20 1317 94     Resp 08/30/20 1317 16     Temp 08/30/20 1317 97.9 F (36.6 C)     Temp Source 08/30/20 1317 Oral     SpO2 08/30/20 1317 97 %     Weight --      Height --      Head Circumference --      Peak Flow --      Pain Score 08/30/20 1315 8     Pain Loc --      Pain Edu? --      Excl. in [place]? --      Physical Exam Vitals and nursing note reviewed.  Constitutional:      General: She is not in acute distress.    Appearance: She is well-developed and well-nourished.  HENT:     Head: Normocephalic and atraumatic.  Eyes:     Conjunctiva/sclera: Conjunctivae normal.  Cardiovascular:     Rate and Rhythm: Normal rate and regular rhythm.     Heart sounds: Normal heart sounds.  Pulmonary:     Effort: Pulmonary effort is normal. No respiratory distress.     Breath sounds: No wheezing.  Abdominal:     General: There is no distension.  Musculoskeletal:        General: No edema.     Cervical back: Neck supple.     Comments: Tenderness to palpation diffusely along the right lower extremity. Tenderness is greatest along the right paraspinal lumbar area. No skin lesions.  Skin:    General: Skin is warm.     Capillary Refill: Capillary refill takes less than 2 seconds.     Findings: No rash.  Neurological:     Mental Status: She is alert and oriented to person, place, and time.     Motor: No abnormal muscle tone.     Comments: Strength is 5  out of 5 bilateral lower extremities. Normal sensation to light touch. Reflexes are 2+ and symmetric       ____________________________________________   LABS (all labs ordered are listed, but only abnormal results are displayed)  Labs Reviewed  CBC WITH DIFFERENTIAL/PLATELET - Abnormal; Notable for the following components:      Result Value   RDW 17.4 (*)    All other components within normal  limits  BASIC METABOLIC PANEL - Abnormal; Notable for the following components:   Potassium 3.0 (*)    Glucose, Bld 173 (*)    All other components within normal limits  MAGNESIUM    ____________________________________________  EKG:  ________________________________________  RADIOLOGY All imaging, including plain films, CT scans, and ultrasounds, independently reviewed by me, and interpretations confirmed via formal radiology reads.  ED MD interpretation:   [place]: No acute DVT  Official radiology report(s): US Venous Img Lower Unilateral Right  Result Date: 08/30/2020 CLINICAL DATA:  Pain EXAM: RIGHT LOWER EXTREMITY VENOUS DOPPLER ULTRASOUND TECHNIQUE: Gray-scale sonography with compression, as well as color and duplex ultrasound, were performed to evaluate the deep venous system(s) from the level of the common femoral vein through the popliteal and proximal calf veins. COMPARISON:  None. FINDINGS: VENOUS Normal compressibility of the common femoral, superficial femoral, and popliteal veins, as well as the visualized calf veins. Visualized portions of profunda femoral vein and great saphenous vein unremarkable. No filling defects to suggest DVT on grayscale or color Doppler imaging. Doppler waveforms show normal direction of venous flow, normal respiratory plasticity and response to augmentation. Limited views of the contralateral common femoral vein are unremarkable. OTHER Edema Limitations: Patient body habitus IMPRESSION: No acute DVT. Electronically Signed   By: Valentino Saxon MD   On:  08/30/2020 16:29    ____________________________________________  PROCEDURES   Procedure(s) performed (including Critical Care):  Procedures  ____________________________________________  INITIAL IMPRESSION / MDM / [place] / ED COURSE  As part of my medical decision making, I reviewed the following data within the [place] notes reviewed and incorporated, Old chart reviewed, Notes from prior ED visits, and [place] Controlled Substance Database       *Marlen Koman was evaluated in Emergency Department on 08/30/2020 for the symptoms described in the history of present illness. She was evaluated in the context of the global COVID-19 pandemic, which necessitated consideration that the patient might be at risk for infection with the SARS-CoV-2 virus that causes COVID-19. Institutional protocols and algorithms that pertain to the evaluation of patients at risk for COVID-19 are in a state of rapid change based on information released by regulatory bodies including the CDC and federal and state organizations. These policies and algorithms were followed during the patient's care in the ED.  Some ED evaluations and interventions may be delayed as a result of limited staffing during the pandemic.*     Medical Decision Making: 65 year old female here with right sided aching leg pain.  Clinically, suspect possible radicular pain from back given distribution along L3-4 distribution, also possible musculoskeletal pain/strain in the setting of increased use from moving.  The pain is reproducible on exam.  Otherwise, no direct trauma, back pain, or red flag symptoms for imaging.  She is neurologically and vascularly intact with 2+ pulses and full strength and sensation.  Ultrasound obtained, reviewed, and is negative.  Electrolytes are remarkable for mild hypokalemia.  This is a chronic issue and unlikely to be causing unilateral, 1 extremity cramps.  Will replace.  Labs  otherwise reassuring.  Will place on naproxen, brief course of analgesics, and advised patient to minimize lifting and twisting.  Return precautions given.  ____________________________________________  FINAL CLINICAL IMPRESSION(S) / ED DIAGNOSES  Final diagnoses:  Right leg pain  Lumbar radiculopathy  Hypokalemia     MEDICATIONS GIVEN DURING THIS VISIT:  Medications  oxyCODONE-acetaminophen (PERCOCET/ROXICET) 5-325 MG per tablet 1 tablet (1 tablet Oral Given 08/30/20  [place])     ED Discharge Orders         Ordered    oxyCODONE-acetaminophen (PERCOCET) 5-325 MG tablet  Every 8 hours PRN,   Status:  Discontinued        08/30/20 1929    potassium chloride (KLOR-CON) 10 MEQ tablet  Daily,   Status:  Discontinued        08/30/20 1929    naproxen (NAPROSYN) 375 MG tablet  2 times daily with meals        08/30/20 1933    oxyCODONE (ROXICODONE) 5 MG immediate release tablet  Every 6 hours PRN        08/30/20 1938    potassium chloride (KLOR-CON) 10 MEQ tablet        08/30/20 1951           Note:  This document was prepared using Dragon voice recognition software and may include unintentional dictation errors.   Duffy Bruce, MD 08/30/20 2035

## 2020-08-30 NOTE — ED Triage Notes (Signed)
Pt to ED via POV stating that she is having pain in her right leg x 1 week. Pt states that it started as cramp in the upper part of her leg but now her whole leg is hurting. Pt went to urgent care but they were concerned she may have a blood clot. Pt finished chemo on 1/11. Pt states that she has had 1 radiation treatment but had to cancel other appt due to her leg. Pt normally gets care at the [place]. Pt is being treated for breast cancer.

## 2020-08-30 NOTE — ED Notes (Signed)
Hardcopy of discharge form printed and signed - unable to access electronic discharge signature page

## 2020-08-30 NOTE — ED Notes (Signed)
Patient is being discharged from the Urgent Care and sent to the Emergency Department via POVPer T. Bast, patient is in need of higher level of care due to r/o DVT Patient is aware and verbalizes understanding of plan of care.  Vitals:   08/30/20 1237  BP: 115/77  Pulse: 98  Resp: 20  Temp: 99.3 F (37.4 C)  SpO2: 95%

## 2020-08-30 NOTE — ED Notes (Signed)
Pt agreeable with d/c plan as discussed by provider- this nurse has verbally reinforced d/c instructions and provided pt with written copy - pt acknowledges verbal understanding and denies any additional questions, concerns, needs.  Escorted to vehicle (driven by sister) via w/c

## 2020-08-30 NOTE — ED Notes (Signed)
Pt currently in ultrasound, will update vital signs when pt returns

## 2020-08-30 NOTE — Discharge Instructions (Signed)
For your back pain:  - Start the naproxen 375 mg twice a day with meals - Take the oxycodone as needed as needed. 0.5 tab for moderate pain, 1 tab for severe pain - Take Tylenol 1000 mg every 6-8 hours for mild pain - No heavy lifting >10 lb for the next week  For your low potassium:  - I've prescribed a taper  - Two tablets twice a day for 3 days - THEN one tablet twice a day for 7 days - THEN one tablet daily for the rest of the month

## 2020-08-30 NOTE — Discharge Instructions (Addendum)
Please go to the ER. There is concern for blood clot in your leg.

## 2020-09-02 ENCOUNTER — Ambulatory Visit
Admission: RE | Admit: 2020-09-02 | Discharge: 2020-09-02 | Disposition: A | Discharge status: Home / Self Care | Payer: No Typology Code available for payment source | Source: Ambulatory Visit | Attending: Radiation Oncology | Admitting: Radiation Oncology

## 2020-09-02 ENCOUNTER — Ambulatory Visit: Payer: No Typology Code available for payment source

## 2020-09-02 ENCOUNTER — Other Ambulatory Visit: Payer: Self-pay

## 2020-09-02 DIAGNOSIS — C50012 Malignant neoplasm of nipple and areola, left female breast: Secondary | ICD-10-CM | POA: Diagnosis not present

## 2020-09-03 ENCOUNTER — Other Ambulatory Visit: Payer: Self-pay

## 2020-09-03 ENCOUNTER — Ambulatory Visit: Payer: No Typology Code available for payment source

## 2020-09-03 ENCOUNTER — Ambulatory Visit
Admission: RE | Admit: 2020-09-03 | Discharge: 2020-09-03 | Disposition: A | Discharge status: Home / Self Care | Payer: No Typology Code available for payment source | Source: Ambulatory Visit | Attending: Radiation Oncology | Admitting: Radiation Oncology

## 2020-09-03 DIAGNOSIS — C50012 Malignant neoplasm of nipple and areola, left female breast: Secondary | ICD-10-CM | POA: Diagnosis not present

## 2020-09-03 MED ORDER — RADIAPLEXRX EX GEL: Freq: Once | CUTANEOUS | Status: AC

## 2020-09-03 MED ORDER — ALRA NON-METALLIC DEODORANT (RAD-ONC)
1.0000 "application " | Freq: Once | TOPICAL | Status: AC
  Administered 2020-09-03: 1 via TOPICAL

## 2020-09-03 NOTE — Progress Notes (Signed)
Pt here for patient teaching.  Pt given Radiation and You booklet, skin care instructions, Alra deodorant and Radiaplex gel.  Reviewed areas of pertinence such as fatigue, hair loss, skin changes, breast tenderness and breast swelling . Pt able to give teach back of to pat skin and use unscented/gentle soap,apply Radiaplex bid, avoid applying anything to skin within 4 hours of treatment, avoid wearing an under wire bra and to use an electric razor if they must shave. Pt verbalizes understanding of information given and will contact nursing with any questions or concerns.     Christianjames Soule M. Lyan Holck RN, BSN      

## 2020-09-04 ENCOUNTER — Ambulatory Visit
Admission: RE | Admit: 2020-09-04 | Discharge: 2020-09-04 | Disposition: A | Discharge status: Home / Self Care | Payer: No Typology Code available for payment source | Source: Ambulatory Visit | Attending: Radiation Oncology | Admitting: Radiation Oncology

## 2020-09-04 ENCOUNTER — Ambulatory Visit: Payer: No Typology Code available for payment source

## 2020-09-04 ENCOUNTER — Other Ambulatory Visit: Payer: Self-pay

## 2020-09-04 DIAGNOSIS — C50012 Malignant neoplasm of nipple and areola, left female breast: Secondary | ICD-10-CM | POA: Diagnosis not present

## 2020-09-05 ENCOUNTER — Other Ambulatory Visit: Payer: Self-pay

## 2020-09-05 ENCOUNTER — Ambulatory Visit: Payer: No Typology Code available for payment source

## 2020-09-05 ENCOUNTER — Ambulatory Visit
Admission: RE | Admit: 2020-09-05 | Discharge: 2020-09-05 | Disposition: A | Discharge status: Home / Self Care | Payer: No Typology Code available for payment source | Source: Ambulatory Visit | Attending: Radiation Oncology | Admitting: Radiation Oncology

## 2020-09-05 DIAGNOSIS — C50012 Malignant neoplasm of nipple and areola, left female breast: Secondary | ICD-10-CM | POA: Diagnosis not present

## 2020-09-06 ENCOUNTER — Ambulatory Visit: Payer: No Typology Code available for payment source

## 2020-09-06 ENCOUNTER — Ambulatory Visit
Admission: RE | Admit: 2020-09-06 | Discharge: 2020-09-06 | Disposition: A | Discharge status: Home / Self Care | Payer: No Typology Code available for payment source | Source: Ambulatory Visit | Attending: Radiation Oncology | Admitting: Radiation Oncology

## 2020-09-06 DIAGNOSIS — C50012 Malignant neoplasm of nipple and areola, left female breast: Secondary | ICD-10-CM | POA: Diagnosis not present

## 2020-09-09 ENCOUNTER — Ambulatory Visit: Payer: No Typology Code available for payment source

## 2020-09-09 ENCOUNTER — Other Ambulatory Visit: Payer: Self-pay

## 2020-09-09 ENCOUNTER — Ambulatory Visit
Admission: RE | Admit: 2020-09-09 | Discharge: 2020-09-09 | Disposition: A | Discharge status: Home / Self Care | Payer: No Typology Code available for payment source | Source: Ambulatory Visit | Attending: Radiation Oncology | Admitting: Radiation Oncology

## 2020-09-09 DIAGNOSIS — C50012 Malignant neoplasm of nipple and areola, left female breast: Secondary | ICD-10-CM | POA: Diagnosis not present

## 2020-09-10 ENCOUNTER — Ambulatory Visit
Admission: RE | Admit: 2020-09-10 | Discharge: 2020-09-10 | Disposition: A | Discharge status: Home / Self Care | Payer: No Typology Code available for payment source | Source: Ambulatory Visit | Attending: Radiation Oncology | Admitting: Radiation Oncology

## 2020-09-10 ENCOUNTER — Other Ambulatory Visit: Payer: Self-pay

## 2020-09-10 DIAGNOSIS — C50012 Malignant neoplasm of nipple and areola, left female breast: Secondary | ICD-10-CM | POA: Diagnosis not present

## 2020-09-11 ENCOUNTER — Other Ambulatory Visit: Payer: Self-pay

## 2020-09-11 ENCOUNTER — Ambulatory Visit
Admission: RE | Admit: 2020-09-11 | Discharge: 2020-09-11 | Disposition: A | Discharge status: Home / Self Care | Payer: No Typology Code available for payment source | Source: Ambulatory Visit | Attending: Radiation Oncology | Admitting: Radiation Oncology

## 2020-09-11 DIAGNOSIS — C50012 Malignant neoplasm of nipple and areola, left female breast: Secondary | ICD-10-CM | POA: Diagnosis not present

## 2020-09-12 ENCOUNTER — Ambulatory Visit
Admission: RE | Admit: 2020-09-12 | Discharge: 2020-09-12 | Disposition: A | Discharge status: Home / Self Care | Payer: No Typology Code available for payment source | Source: Ambulatory Visit | Attending: Radiation Oncology | Admitting: Radiation Oncology

## 2020-09-12 ENCOUNTER — Encounter: Payer: Self-pay | Admitting: Radiation Oncology

## 2020-09-12 DIAGNOSIS — C50012 Malignant neoplasm of nipple and areola, left female breast: Secondary | ICD-10-CM | POA: Diagnosis not present

## 2020-09-13 ENCOUNTER — Ambulatory Visit
Admission: RE | Admit: 2020-09-13 | Discharge: 2020-09-13 | Disposition: A | Discharge status: Home / Self Care | Payer: No Typology Code available for payment source | Source: Ambulatory Visit | Attending: Radiation Oncology | Admitting: Radiation Oncology

## 2020-09-13 ENCOUNTER — Ambulatory Visit: Payer: No Typology Code available for payment source | Admitting: Radiation Oncology

## 2020-09-13 DIAGNOSIS — C50012 Malignant neoplasm of nipple and areola, left female breast: Secondary | ICD-10-CM | POA: Diagnosis not present

## 2020-09-16 ENCOUNTER — Ambulatory Visit
Admission: RE | Admit: 2020-09-16 | Discharge: 2020-09-16 | Disposition: A | Discharge status: Home / Self Care | Payer: No Typology Code available for payment source | Source: Ambulatory Visit | Attending: Radiation Oncology | Admitting: Radiation Oncology

## 2020-09-16 ENCOUNTER — Other Ambulatory Visit: Payer: Self-pay

## 2020-09-16 DIAGNOSIS — C50012 Malignant neoplasm of nipple and areola, left female breast: Secondary | ICD-10-CM | POA: Diagnosis not present

## 2020-09-17 ENCOUNTER — Other Ambulatory Visit: Payer: Self-pay

## 2020-09-17 ENCOUNTER — Ambulatory Visit
Admission: RE | Admit: 2020-09-17 | Discharge: 2020-09-17 | Disposition: A | Discharge status: Home / Self Care | Payer: No Typology Code available for payment source | Source: Ambulatory Visit | Attending: Radiation Oncology | Admitting: Radiation Oncology

## 2020-09-17 DIAGNOSIS — C50012 Malignant neoplasm of nipple and areola, left female breast: Secondary | ICD-10-CM | POA: Diagnosis not present

## 2020-09-18 ENCOUNTER — Ambulatory Visit
Admission: RE | Admit: 2020-09-18 | Discharge: 2020-09-18 | Disposition: A | Discharge status: Home / Self Care | Payer: No Typology Code available for payment source | Source: Ambulatory Visit | Attending: Radiation Oncology | Admitting: Radiation Oncology

## 2020-09-18 ENCOUNTER — Other Ambulatory Visit: Payer: Self-pay

## 2020-09-18 DIAGNOSIS — C50012 Malignant neoplasm of nipple and areola, left female breast: Secondary | ICD-10-CM | POA: Diagnosis not present

## 2020-09-18 NOTE — Progress Notes (Signed)
  Radiation Oncology         [phone]) [phone] ________________________________  Name: Elizabeth Ali MRN: [id number]  Date: 09/12/2020  DOB: [birth year]  SIMULATION NOTE   NARRATIVE:  The patient underwent simulation today for ongoing radiation therapy.  The existing CT study set was employed for the purpose of virtual treatment planning.  The target and avoidance structures were reviewed and modified as necessary.  Treatment planning then occurred.  The radiation boost prescription was entered and confirmed.  A total of 3 complex treatment devices were fabricated in the form of multi-leaf collimators to shape radiation around the targets while maximally excluding nearby normal structures. I have requested : Isodose Plan.    PLAN:  This modified radiation beam arrangement is intended to continue the current radiation dose to an additional 8 Gy in 4 fractions for a total cumulative dose of 50.56 Gy.    ------------------------------------------------  Jodelle Gross, MD, PhD

## 2020-09-19 ENCOUNTER — Ambulatory Visit
Admission: RE | Admit: 2020-09-19 | Discharge: 2020-09-19 | Disposition: A | Discharge status: Home / Self Care | Payer: No Typology Code available for payment source | Source: Ambulatory Visit | Attending: Radiation Oncology | Admitting: Radiation Oncology

## 2020-09-19 ENCOUNTER — Ambulatory Visit: Payer: No Typology Code available for payment source

## 2020-09-19 DIAGNOSIS — C50012 Malignant neoplasm of nipple and areola, left female breast: Secondary | ICD-10-CM | POA: Diagnosis not present

## 2020-09-20 ENCOUNTER — Ambulatory Visit
Admission: RE | Admit: 2020-09-20 | Discharge: 2020-09-20 | Disposition: A | Discharge status: Home / Self Care | Payer: No Typology Code available for payment source | Source: Ambulatory Visit | Attending: Radiation Oncology | Admitting: Radiation Oncology

## 2020-09-20 ENCOUNTER — Ambulatory Visit: Payer: No Typology Code available for payment source

## 2020-09-20 ENCOUNTER — Other Ambulatory Visit: Payer: Self-pay

## 2020-09-20 DIAGNOSIS — C50012 Malignant neoplasm of nipple and areola, left female breast: Secondary | ICD-10-CM | POA: Diagnosis not present

## 2020-09-23 ENCOUNTER — Ambulatory Visit: Payer: No Typology Code available for payment source

## 2020-09-23 ENCOUNTER — Ambulatory Visit
Admission: RE | Admit: 2020-09-23 | Discharge: 2020-09-23 | Disposition: A | Discharge status: Home / Self Care | Payer: No Typology Code available for payment source | Source: Ambulatory Visit | Attending: Radiation Oncology | Admitting: Radiation Oncology

## 2020-09-23 ENCOUNTER — Other Ambulatory Visit: Payer: Self-pay

## 2020-09-23 DIAGNOSIS — C50012 Malignant neoplasm of nipple and areola, left female breast: Secondary | ICD-10-CM | POA: Diagnosis not present

## 2020-09-24 ENCOUNTER — Ambulatory Visit
Admission: RE | Admit: 2020-09-24 | Discharge: 2020-09-24 | Disposition: A | Discharge status: Home / Self Care | Payer: No Typology Code available for payment source | Source: Ambulatory Visit | Attending: Radiation Oncology | Admitting: Radiation Oncology

## 2020-09-24 ENCOUNTER — Ambulatory Visit: Payer: No Typology Code available for payment source

## 2020-09-24 ENCOUNTER — Other Ambulatory Visit: Payer: Self-pay

## 2020-09-24 DIAGNOSIS — C50012 Malignant neoplasm of nipple and areola, left female breast: Secondary | ICD-10-CM | POA: Diagnosis not present

## 2020-09-24 DIAGNOSIS — Z171 Estrogen receptor negative status [ER-]: Secondary | ICD-10-CM | POA: Insufficient documentation

## 2020-09-25 ENCOUNTER — Ambulatory Visit
Admission: RE | Admit: 2020-09-25 | Discharge: 2020-09-25 | Disposition: A | Discharge status: Home / Self Care | Payer: No Typology Code available for payment source | Source: Ambulatory Visit | Attending: Radiation Oncology | Admitting: Radiation Oncology

## 2020-09-25 ENCOUNTER — Ambulatory Visit: Payer: No Typology Code available for payment source

## 2020-09-25 DIAGNOSIS — C50012 Malignant neoplasm of nipple and areola, left female breast: Secondary | ICD-10-CM | POA: Diagnosis not present

## 2020-09-26 ENCOUNTER — Encounter: Payer: Self-pay | Admitting: Radiation Oncology

## 2020-09-26 ENCOUNTER — Ambulatory Visit
Admission: RE | Admit: 2020-09-26 | Discharge: 2020-09-26 | Disposition: A | Discharge status: Home / Self Care | Payer: No Typology Code available for payment source | Source: Ambulatory Visit | Attending: Radiation Oncology | Admitting: Radiation Oncology

## 2020-09-26 DIAGNOSIS — C50012 Malignant neoplasm of nipple and areola, left female breast: Secondary | ICD-10-CM | POA: Diagnosis not present

## 2020-09-30 NOTE — Progress Notes (Signed)
  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  Intent: Curative  Radiation Treatment Dates: 08/28/2020 through 09/26/2020 Site Technique Total Dose (Gy) Dose per Fx (Gy) Completed Fx Beam Energies  Breast, Left: Breast_Lt 3D 42.56/42.56 2.66 16/16 6X, 10X  Breast, Left: Breast_Lt_Bst 3D 8/8 2 4/4 6X, 10X   Narrative: The patient tolerated radiation therapy relatively well. She developed anticipated skin changes in the treatment field.   Plan: The patient will receive a call in about one month from the radiation oncology department. She will continue follow up with Dr. Athena Masse as well.   ________________________________________________    Carola Rhine, [facility]

## 2020-10-28 ENCOUNTER — Ambulatory Visit
Admission: RE | Admit: 2020-10-28 | Discharge: 2020-10-28 | Disposition: A | Discharge status: Home / Self Care | Payer: Medicare Other | Source: Ambulatory Visit | Attending: Radiation Oncology | Admitting: Radiation Oncology

## 2020-10-28 ENCOUNTER — Other Ambulatory Visit: Payer: Self-pay

## 2020-10-28 NOTE — Progress Notes (Signed)
One month follow-up post treatment call. Patient states she is doing well after radiation treatment. Patient states that she still has a slight rash that is going away. Advised patient that skin is sensitive for about a year after radiation treatment. Advised patient that skin has the potential to burn easily after radiation. Advised patient to use and SPF of 30 or higher and to wear protective clothing. Patient verbalized understanding of information given. Patient states that she will follow up with Dr. Athena Masse at the [facility].

## 2021-02-15 ENCOUNTER — Other Ambulatory Visit: Payer: Self-pay

## 2021-02-15 ENCOUNTER — Emergency Department (HOSPITAL_COMMUNITY)
Admission: EM | Admit: 2021-02-15 | Discharge: 2021-02-15 | Discharge status: Against Medical Advice | Payer: No Typology Code available for payment source | Attending: Emergency Medicine | Admitting: Emergency Medicine

## 2021-02-15 DIAGNOSIS — R21 Rash and other nonspecific skin eruption: Secondary | ICD-10-CM | POA: Insufficient documentation

## 2021-02-15 NOTE — ED Triage Notes (Signed)
Pt reports itching rash all over her body this morning at 0500. Previous similar reaction to progesterone, but states she has been off of that medication to one week.

## 2021-02-15 NOTE — ED Notes (Signed)
Pt informed registration that she was leaving and no longer wanted to wait.

## 2021-05-30 ENCOUNTER — Other Ambulatory Visit: Payer: Self-pay | Admitting: Hematology & Oncology

## 2021-05-30 DIAGNOSIS — Z853 Personal history of malignant neoplasm of breast: Secondary | ICD-10-CM

## 2021-06-22 ENCOUNTER — Other Ambulatory Visit: Payer: Self-pay

## 2021-06-22 ENCOUNTER — Ambulatory Visit
Admission: RE | Admit: 2021-06-22 | Discharge: 2021-06-22 | Disposition: A | Discharge status: Home / Self Care | Payer: Medicare Other | Source: Ambulatory Visit | Attending: Hematology & Oncology | Admitting: Hematology & Oncology

## 2021-06-22 DIAGNOSIS — Z853 Personal history of malignant neoplasm of breast: Secondary | ICD-10-CM

## 2021-06-22 MED ORDER — GADOBUTROL 1 MMOL/ML IV SOLN
9.0000 mL | Freq: Once | INTRAVENOUS | Status: AC | PRN
  Administered 2021-06-22: 10:00:00 9 mL via INTRAVENOUS

## 2021-08-19 IMAGING — US US EXTREM LOW VENOUS*R*
1 series · 14 of 24 positions shown · non-contrast
Comparison: None.

CLINICAL DATA: Pain

EXAM:
RIGHT LOWER EXTREMITY VENOUS DOPPLER ULTRASOUND
TECHNIQUE: Gray-scale sonography with compression, as well as color and duplex
ultrasound, were performed to evaluate the deep venous system(s)
from the level of the common femoral vein through the popliteal and
proximal calf veins.

[Series 1: us venous img lower uni right (dvt) · portal-venous · 14 of 36 slices shown]
[im 1/36]
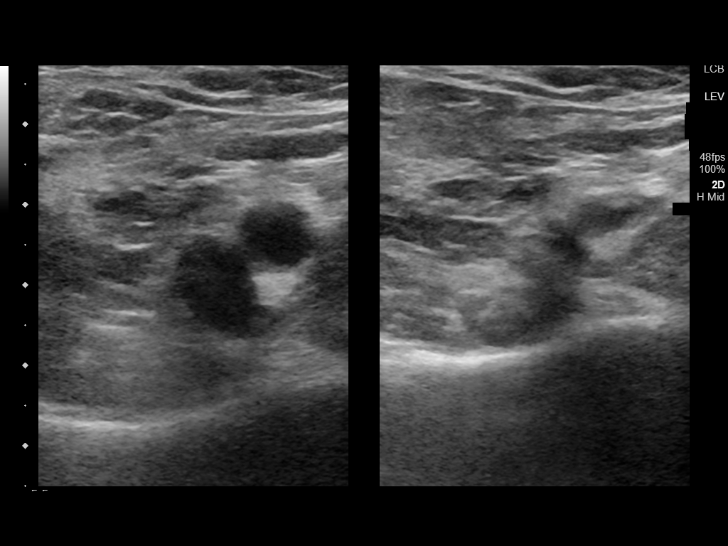
[im 4/36]
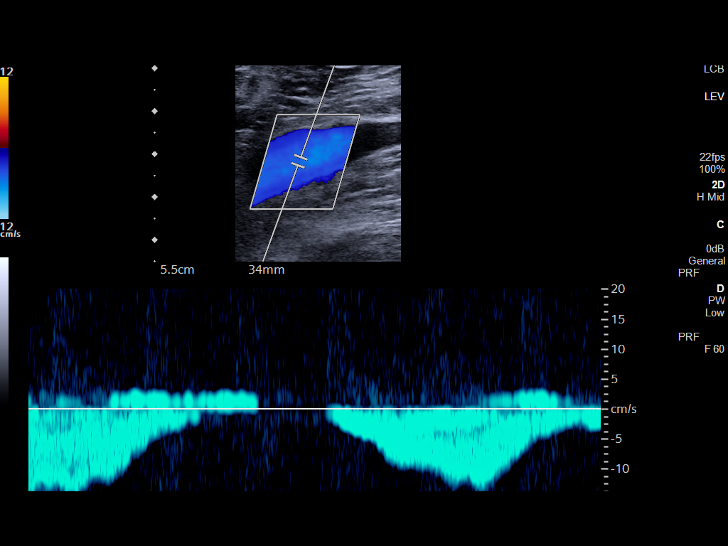
[im 7/36]
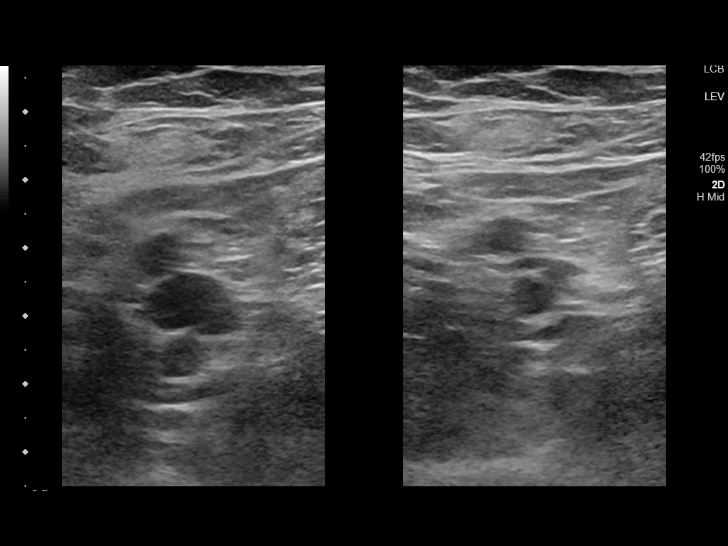
[im 10/36]
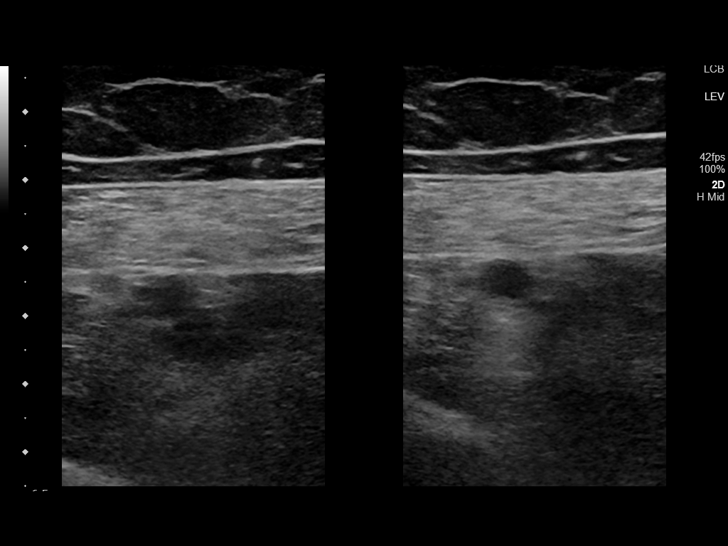
[im 11/36]
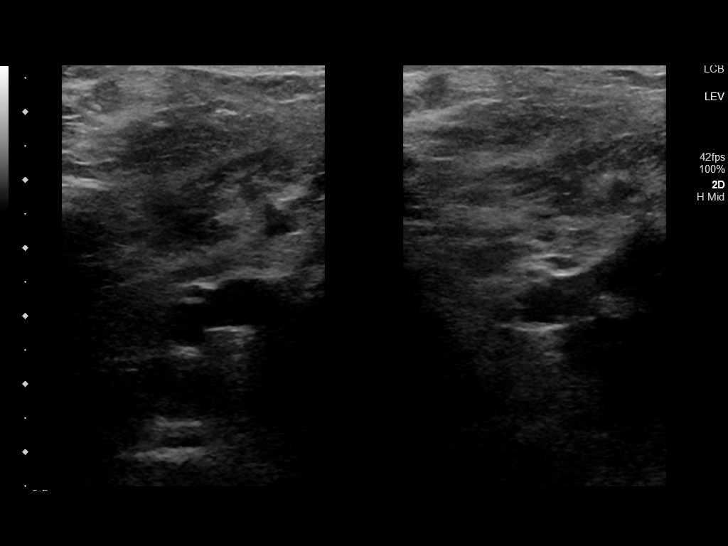
[im 14/36]
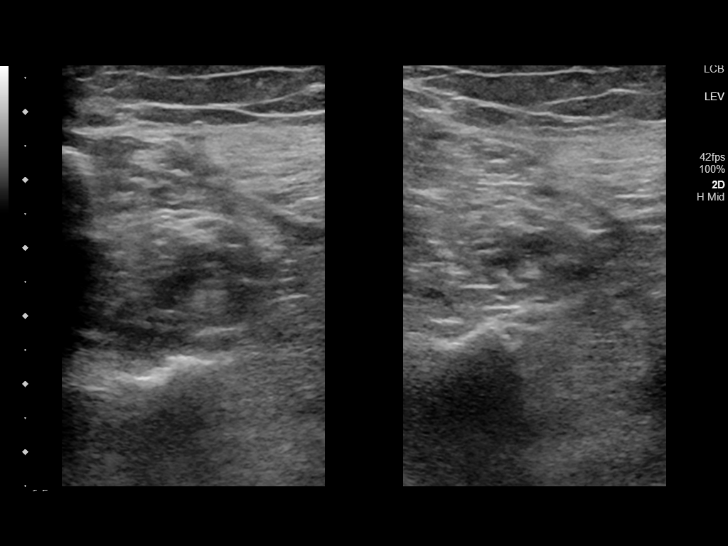
[im 17/36]
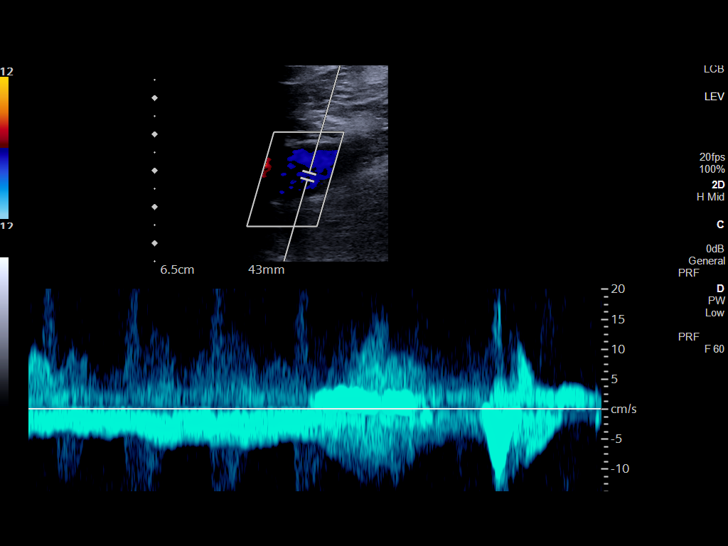
[im 19/36]
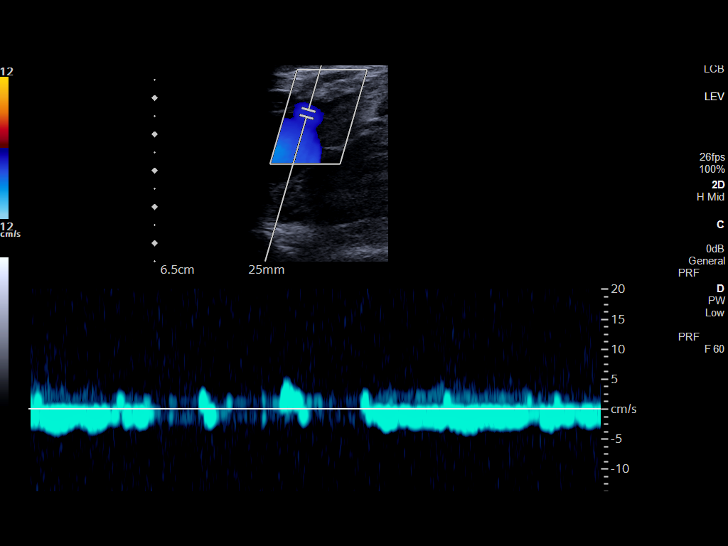
[im 22/36]
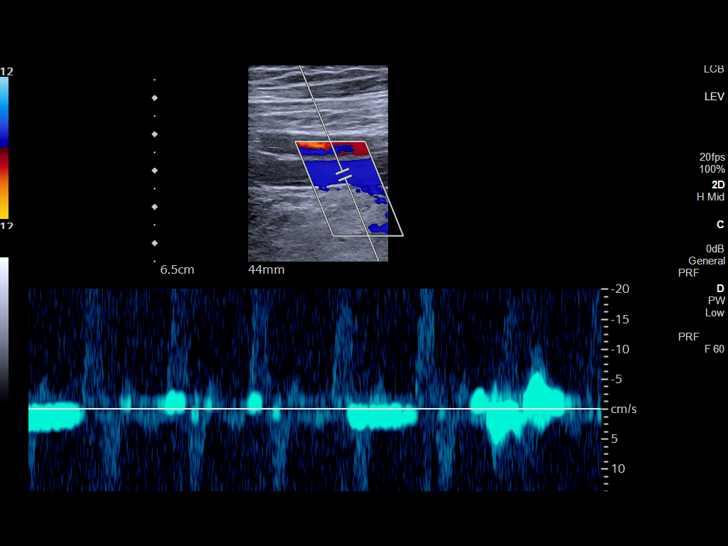
[im 25/36]
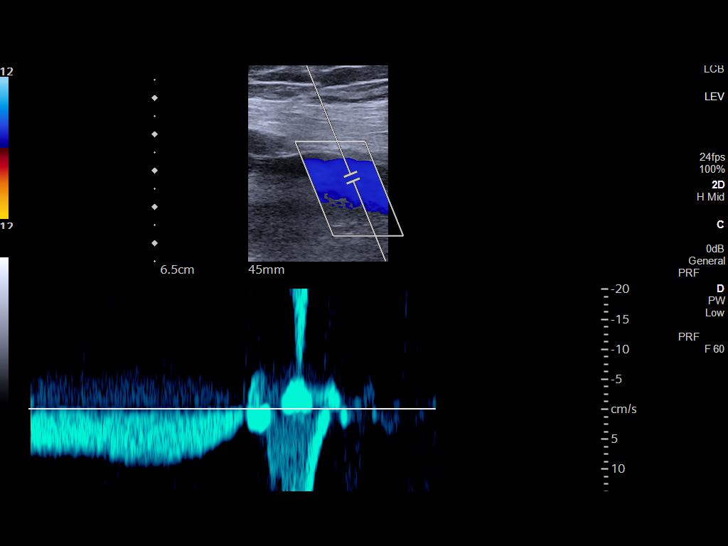
[im 28/36]
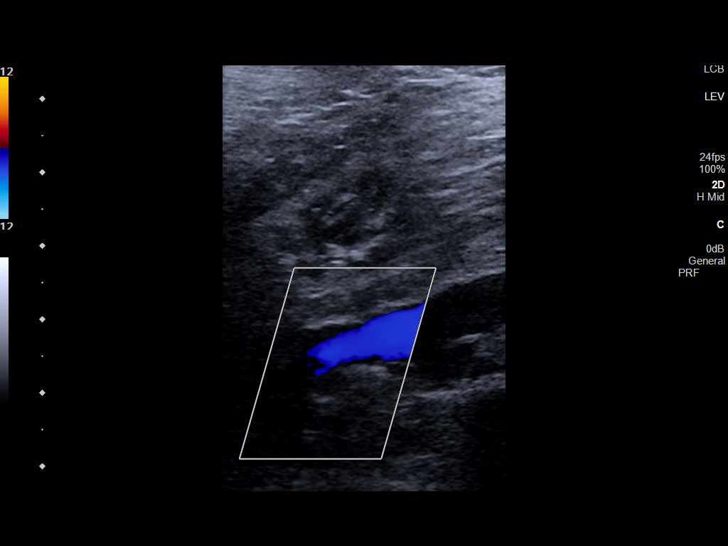
[im 29/36]
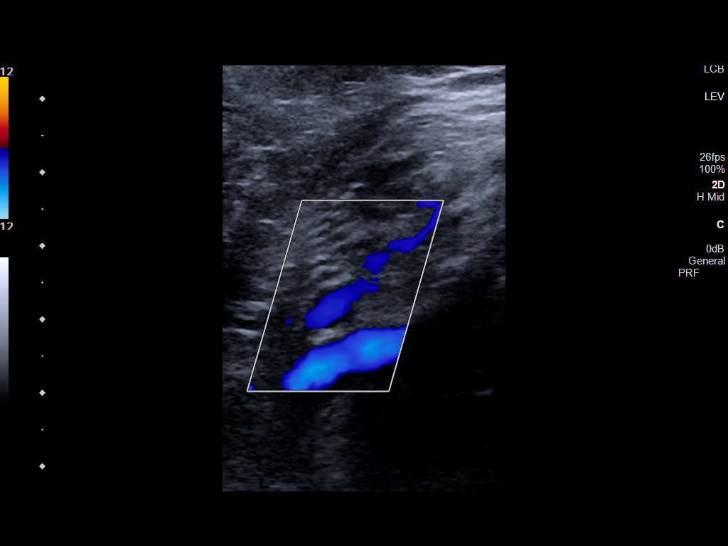
[im 32/36]
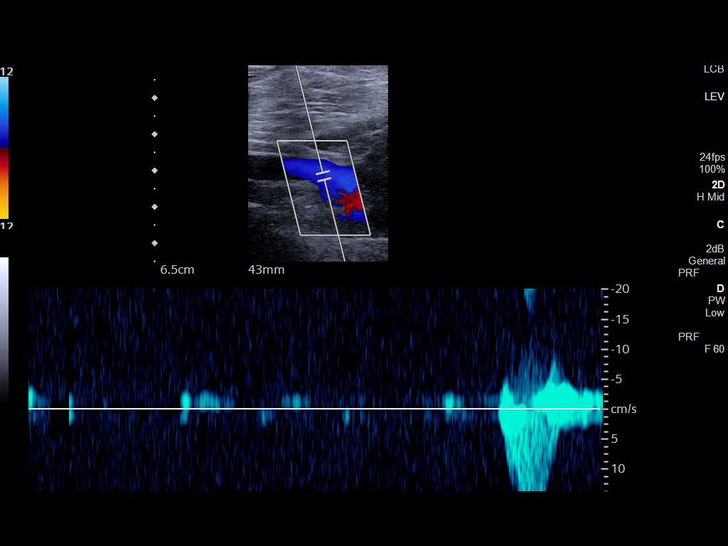
[im 36/36]
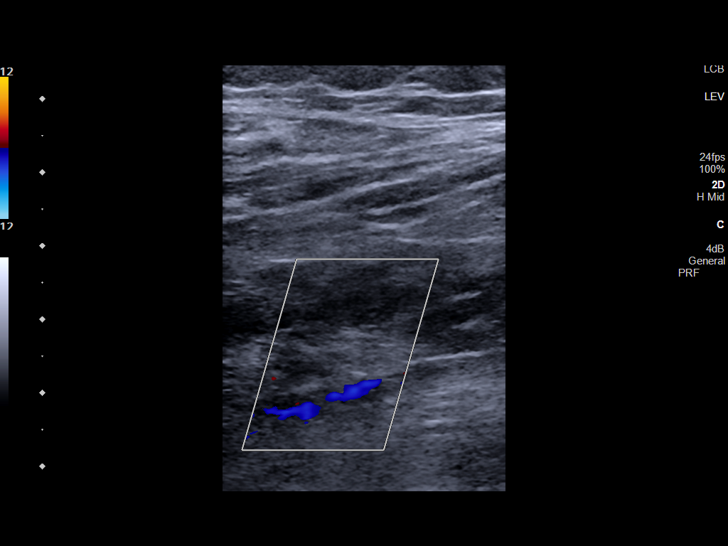

[14 of 24 positions shown; findings below may reference images not displayed]

FINDINGS: VENOUS

Normal compressibility of the common femoral, superficial femoral,
and popliteal veins, as well as the visualized calf veins.
Visualized portions of profunda femoral vein and great saphenous
vein unremarkable. No filling defects to suggest DVT on grayscale or
color Doppler imaging. Doppler waveforms show normal direction of
venous flow, normal respiratory plasticity and response to
augmentation.

Limited views of the contralateral common femoral vein are
unremarkable.

OTHER

Edema

Limitations: Patient body habitus
IMPRESSION: No acute DVT.

## 2023-03-04 ENCOUNTER — Encounter (HOSPITAL_COMMUNITY): Payer: Self-pay

## 2023-03-04 ENCOUNTER — Emergency Department (HOSPITAL_COMMUNITY)
Admission: EM | Admit: 2023-03-04 | Discharge: 2023-03-05 | Disposition: A | Discharge status: Home / Self Care | Payer: No Typology Code available for payment source | Attending: Emergency Medicine | Admitting: Emergency Medicine

## 2023-03-04 DIAGNOSIS — M549 Dorsalgia, unspecified: Secondary | ICD-10-CM | POA: Insufficient documentation

## 2023-03-04 DIAGNOSIS — Y92410 Unspecified street and highway as the place of occurrence of the external cause: Secondary | ICD-10-CM | POA: Diagnosis not present

## 2023-03-04 DIAGNOSIS — S5002XA Contusion of left elbow, initial encounter: Secondary | ICD-10-CM | POA: Insufficient documentation

## 2023-03-04 DIAGNOSIS — S8012XA Contusion of left lower leg, initial encounter: Secondary | ICD-10-CM | POA: Insufficient documentation

## 2023-03-04 DIAGNOSIS — R739 Hyperglycemia, unspecified: Secondary | ICD-10-CM | POA: Diagnosis not present

## 2023-03-04 DIAGNOSIS — S0990XA Unspecified injury of head, initial encounter: Secondary | ICD-10-CM | POA: Insufficient documentation

## 2023-03-04 DIAGNOSIS — T07XXXA Unspecified multiple injuries, initial encounter: Secondary | ICD-10-CM

## 2023-03-04 DIAGNOSIS — Z79899 Other long term (current) drug therapy: Secondary | ICD-10-CM | POA: Insufficient documentation

## 2023-03-04 DIAGNOSIS — M7989 Other specified soft tissue disorders: Secondary | ICD-10-CM | POA: Insufficient documentation

## 2023-03-04 DIAGNOSIS — S8992XA Unspecified injury of left lower leg, initial encounter: Secondary | ICD-10-CM | POA: Diagnosis present

## 2023-03-04 DIAGNOSIS — I10 Essential (primary) hypertension: Secondary | ICD-10-CM | POA: Insufficient documentation
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
## 2023-03-05 ENCOUNTER — Emergency Department (HOSPITAL_COMMUNITY): Payer: No Typology Code available for payment source

## 2023-03-05 LAB — CBG MONITORING, ED: Glucose-Capillary: 277 mg/dL — ABNORMAL HIGH (ref 70–99)

## 2023-03-05 LAB — CBC WITH DIFFERENTIAL/PLATELET
Abs Immature Granulocytes: 0.02 10*3/uL (ref 0.00–0.07)
Basophils Absolute: 0 10*3/uL (ref 0.0–0.1)
Basophils Relative: 0 %
Eosinophils Absolute: 0.1 10*3/uL (ref 0.0–0.5)
Eosinophils Relative: 1 %
HCT: 38.6 % (ref 36.0–46.0)
Hemoglobin: 13 g/dL (ref 12.0–15.0)
Immature Granulocytes: 0 %
Lymphocytes Relative: 21 %
Lymphs Abs: 1.5 10*3/uL (ref 0.7–4.0)
MCH: 28 pg (ref 26.0–34.0)
MCHC: 33.7 g/dL (ref 30.0–36.0)
MCV: 83 fL (ref 80.0–100.0)
Monocytes Absolute: 0.5 10*3/uL (ref 0.1–1.0)
Monocytes Relative: 7 %
Neutro Abs: 4.9 10*3/uL (ref 1.7–7.7)
Neutrophils Relative %: 71 %
Platelets: 187 10*3/uL (ref 150–400)
RBC: 4.65 MIL/uL (ref 3.87–5.11)
RDW: 13.8 % (ref 11.5–15.5)
WBC: 6.9 10*3/uL (ref 4.0–10.5)
nRBC: 0 % (ref 0.0–0.2)

## 2023-03-05 LAB — BASIC METABOLIC PANEL
Anion gap: 12 (ref 5–15)
BUN: 11 mg/dL (ref 8–23)
CO2: 26 mmol/L (ref 22–32)
Calcium: 9.3 mg/dL (ref 8.9–10.3)
Chloride: 98 mmol/L (ref 98–111)
Creatinine, Ser: 1.03 mg/dL — ABNORMAL HIGH (ref 0.44–1.00)
GFR, Estimated: 59 mL/min — ABNORMAL LOW (ref >60)
Glucose, Bld: 292 mg/dL — ABNORMAL HIGH (ref 70–99)
Potassium: 3.2 mmol/L — ABNORMAL LOW (ref 3.5–5.1)
Sodium: 136 mmol/L (ref 135–145)

## 2023-03-05 LAB — TROPONIN I (HIGH SENSITIVITY)
Troponin I (High Sensitivity): 2 ng/L (ref <18)
Troponin I (High Sensitivity): 3 ng/L (ref <18)

## 2023-03-05 MED ORDER — ACETAMINOPHEN 325 MG PO TABS
650.0000 mg | ORAL_TABLET | Freq: Once | ORAL | Status: AC
  Administered 2023-03-05: 650 mg via ORAL
  Filled 2023-03-05: qty 2

## 2023-03-05 MED ORDER — NAPROXEN 500 MG PO TABS: 500.0000 mg | ORAL_TABLET | Freq: Two times a day (BID) | ORAL | 0 refills | Status: AC | PRN

## 2023-03-05 NOTE — ED Notes (Signed)
Patient transported to CT 

## 2023-03-05 NOTE — ED Notes (Signed)
Pt ambulated in the room with no assistance.
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
## 2023-03-05 NOTE — Discharge Instructions (Signed)
Your x-rays and CT scans are negative for acute traumatic injury.  Your blood sugar today is slightly elevated and there is concern he could be diabetic.  Take your blood sugar medications as prescribed by your doctor and monitor your blood sugars closely at home.  AS discussed, you could have a small partial tear of your muscle in your calf or your Achilles tendon.  Wear the brace and follow-up with the orthopedic doctor.  Return to the ED with new or worsening symptoms.

## 2023-03-11 ENCOUNTER — Telehealth: Payer: Self-pay | Admitting: *Deleted

## 2023-03-11 NOTE — Telephone Encounter (Signed)
Transition Care Management Unsuccessful Follow-up Telephone Call  Date of discharge and from where:  [facility]  03/05/2023  Attempts:  1st Attempt  Reason for unsuccessful TCM follow-up call:  No answer/busy
